# Patient Record
Sex: Female | Born: 2005 | Race: White | Hispanic: Yes | Marital: Single
Health system: Southern US, Community
[De-identification: ages and names within clinical notes are randomized; demographics above are authoritative.]

---
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
## 2023-02-23 DIAGNOSIS — J019 Acute sinusitis, unspecified: Secondary | ICD-10-CM | POA: Diagnosis not present

## 2023-02-23 DIAGNOSIS — J029 Acute pharyngitis, unspecified: Secondary | ICD-10-CM | POA: Diagnosis not present

## 2023-03-21 DIAGNOSIS — Z202 Contact with and (suspected) exposure to infections with a predominantly sexual mode of transmission: Secondary | ICD-10-CM | POA: Diagnosis not present

## 2023-03-21 DIAGNOSIS — R309 Painful micturition, unspecified: Secondary | ICD-10-CM | POA: Diagnosis not present

## 2023-06-13 DIAGNOSIS — J111 Influenza due to unidentified influenza virus with other respiratory manifestations: Secondary | ICD-10-CM | POA: Diagnosis not present

## 2023-08-23 DIAGNOSIS — J3489 Other specified disorders of nose and nasal sinuses: Secondary | ICD-10-CM | POA: Diagnosis not present

## 2023-08-23 DIAGNOSIS — N946 Dysmenorrhea, unspecified: Secondary | ICD-10-CM | POA: Diagnosis not present

## 2023-08-23 DIAGNOSIS — Z0000 Encounter for general adult medical examination without abnormal findings: Secondary | ICD-10-CM | POA: Diagnosis not present

## 2023-08-23 DIAGNOSIS — K5900 Constipation, unspecified: Secondary | ICD-10-CM | POA: Diagnosis not present

## 2023-09-12 IMAGING — US US ABDOMEN LIMITED
1 series · 14 of 25 positions shown · non-contrast
Comparison: None Available.

CLINICAL DATA: Epigastric pain.

EXAM:
ULTRASOUND ABDOMEN LIMITED RIGHT UPPER QUADRANT

[Series 1: us abdomen limited ruq (liver/gb) · 14 of 74 slices shown]
[im 1/74]
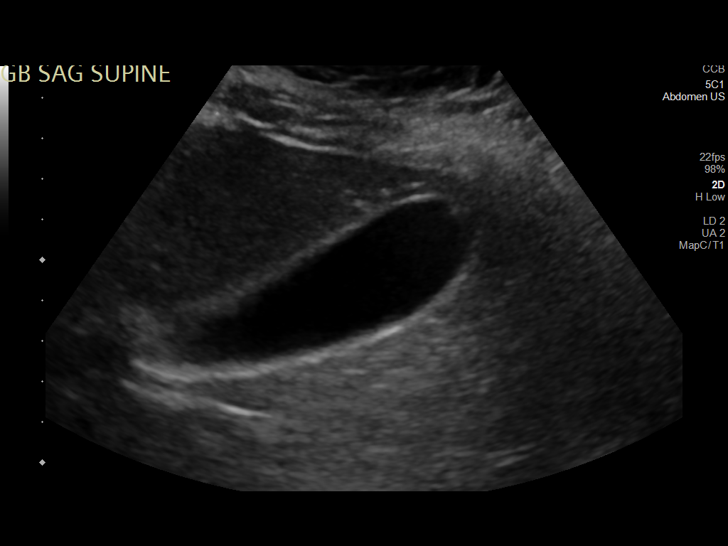
[im 7/74]
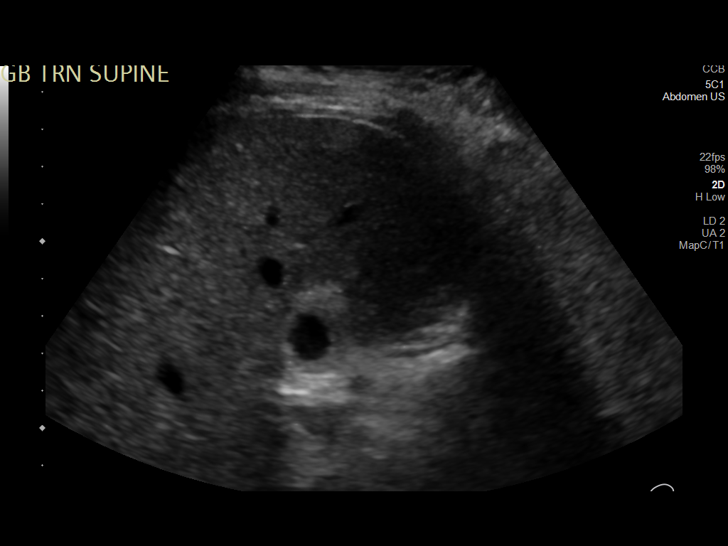
[im 13/74]
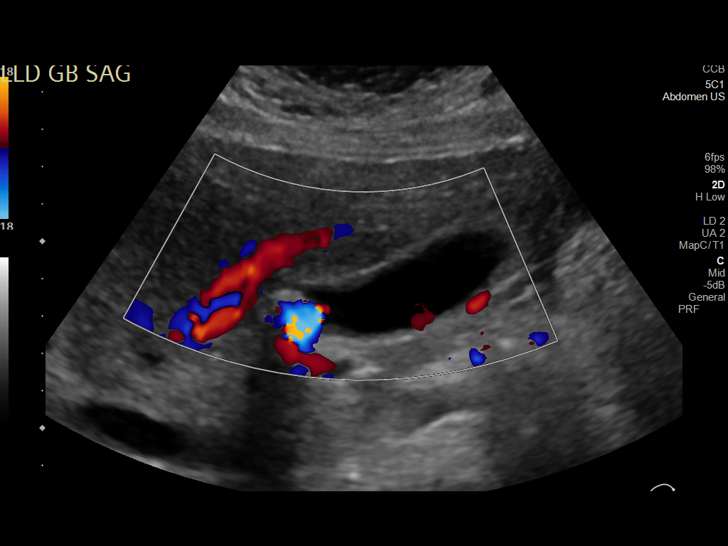
[im 19/74]
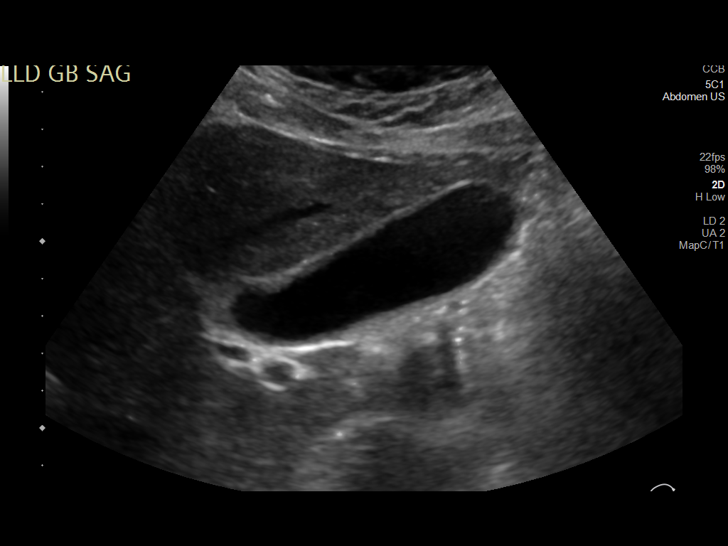
[im 25/74]
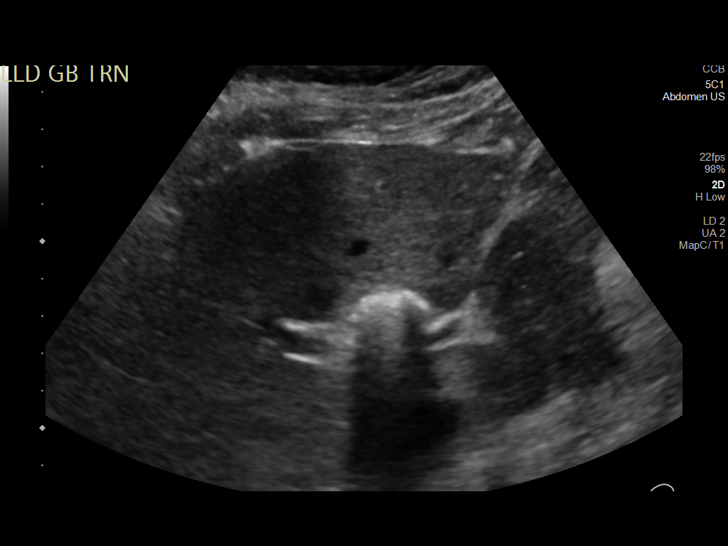
[im 28/74]
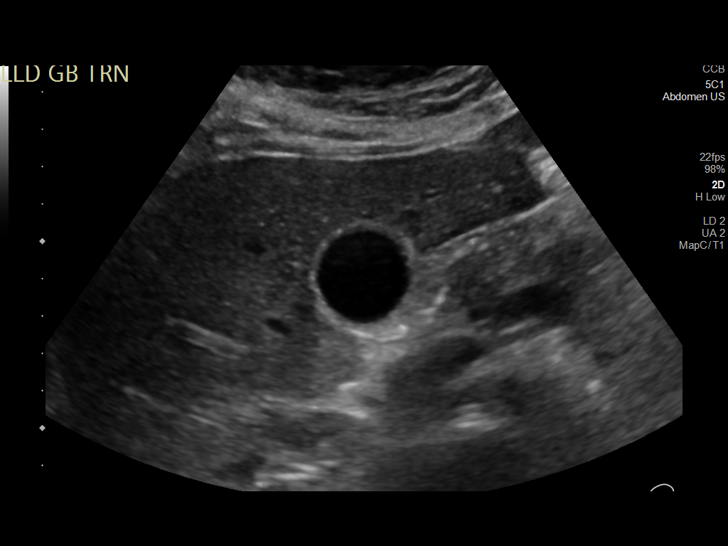
[im 34/74]
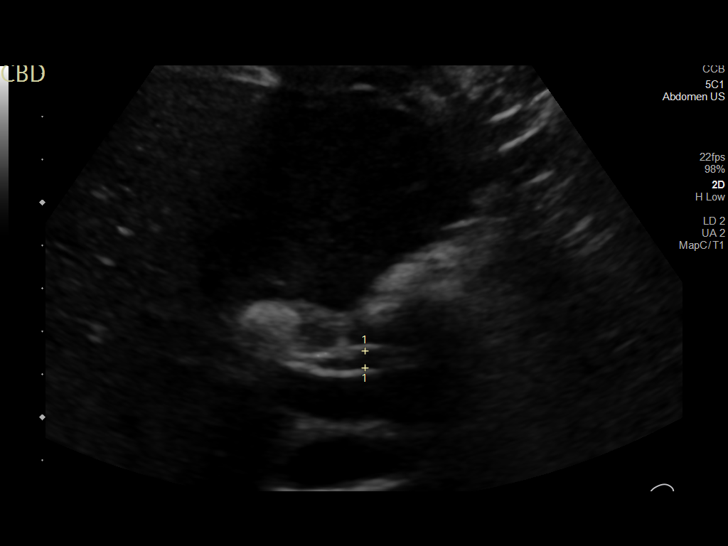
[im 40/74]
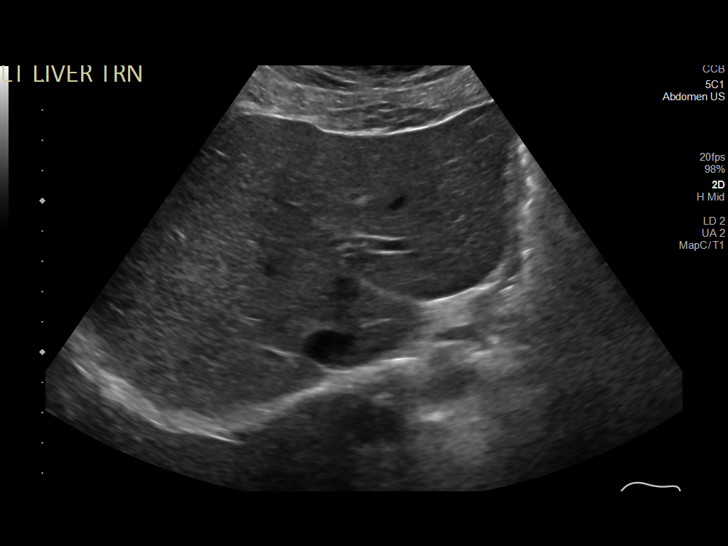
[im 46/74]
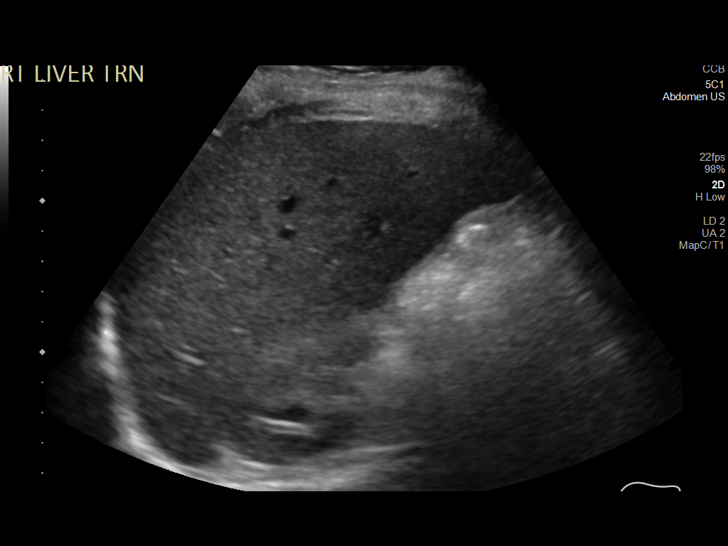
[im 49/74]
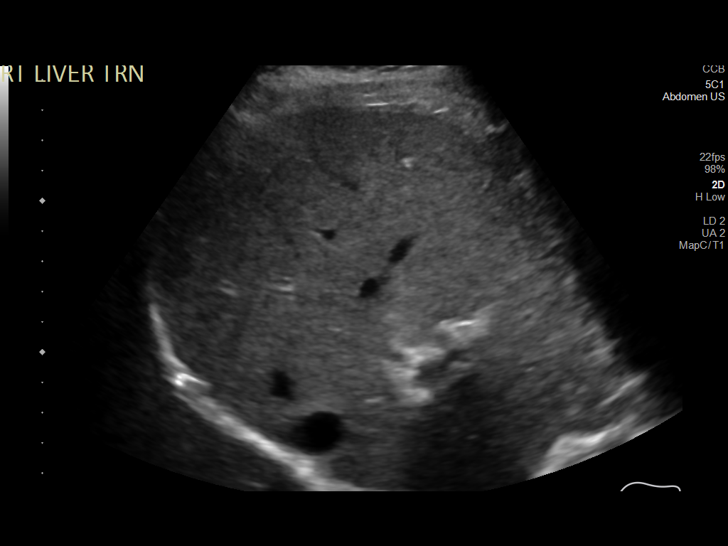
[im 55/74]
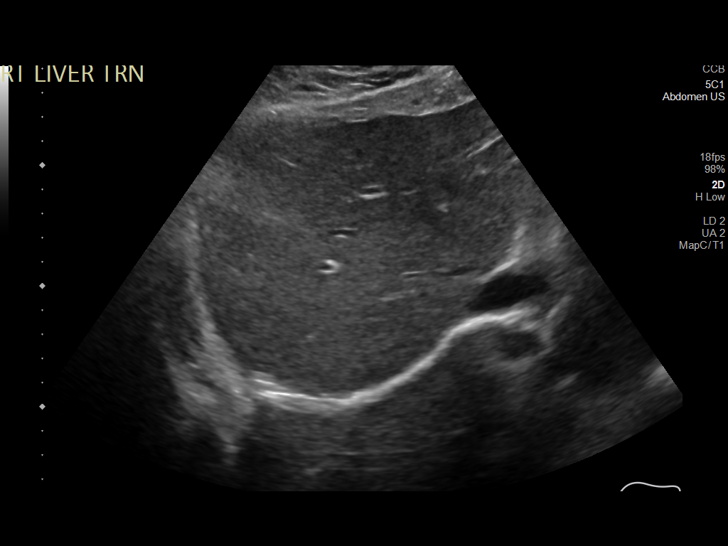
[im 61/74]
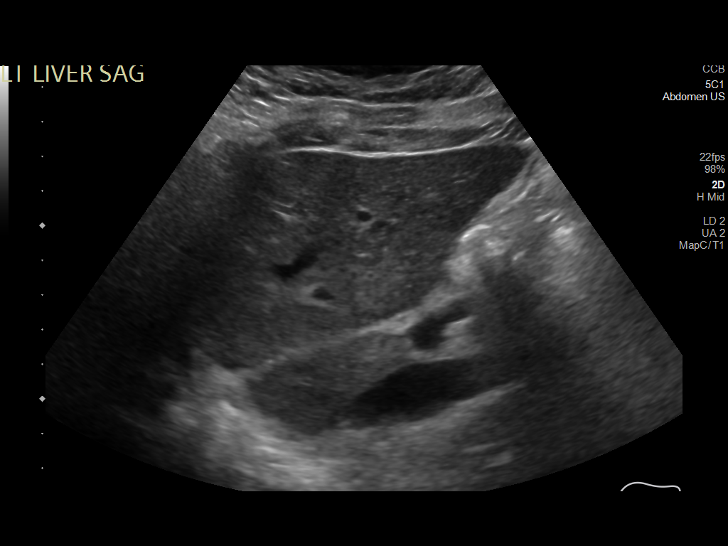
[im 67/74]
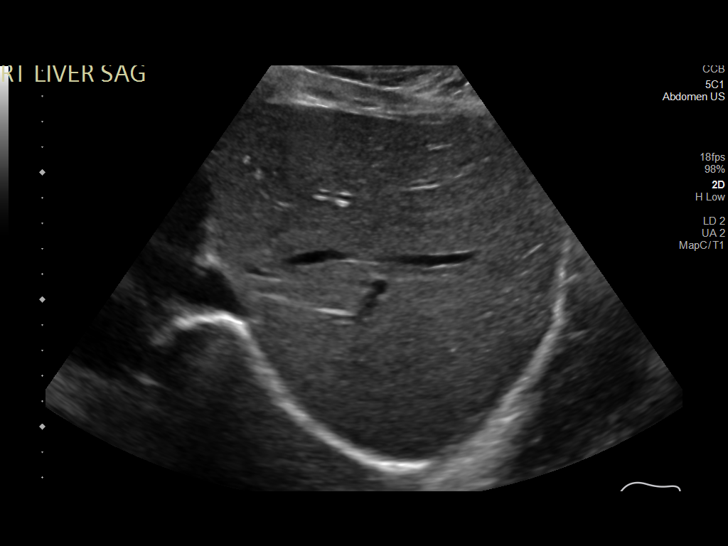
[im 74/74]
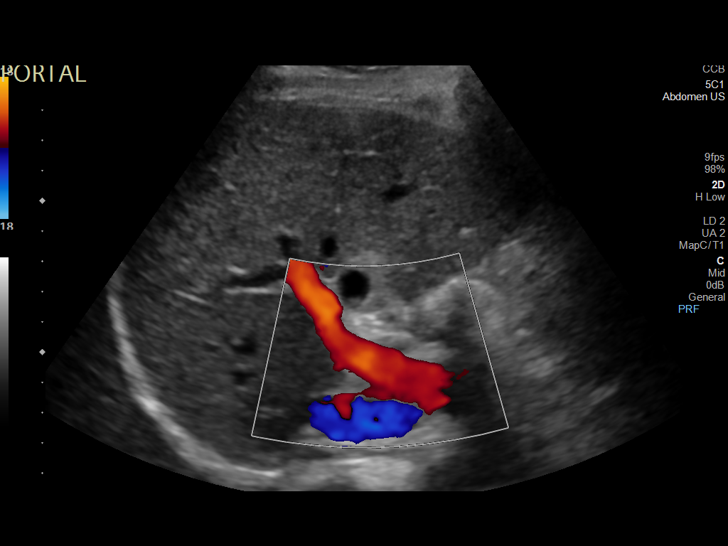

[14 of 25 positions shown; findings below may reference images not displayed]

FINDINGS: Gallbladder:

There is a nonmobile stone lodged within the gallbladder neck
measuring 2.1 cm. Mild gallbladder wall thickening measures 3.2 mm.
No pericholecystic fluid or sonographic Murphy's sign.

Common bile duct:

Diameter: 5.5 mm

Liver:

No focal lesion identified. Within normal limits in parenchymal
echogenicity. Portal vein is patent on color Doppler imaging with
normal direction of blood flow towards the liver.

Other: None.
IMPRESSION: 2.1 cm nonmobile gallstone within the gallbladder neck with mild
gallbladder wall thickening. Cannot rule out cholecystitis.

## 2023-10-23 DIAGNOSIS — R21 Rash and other nonspecific skin eruption: Secondary | ICD-10-CM | POA: Diagnosis not present

## 2023-10-25 IMAGING — US US ABDOMEN LIMITED
1 series · 13 of 25 positions shown · non-contrast
Comparison: Right upper quadrant ultrasound 09/21/2021.

CLINICAL DATA: Provided history: Right upper quadrant pain.

EXAM:
ULTRASOUND ABDOMEN LIMITED RIGHT UPPER QUADRANT

[Series 1: us abdomen limited ruq (liver/gb) · 13 of 72 slices shown]
[im 1/72]
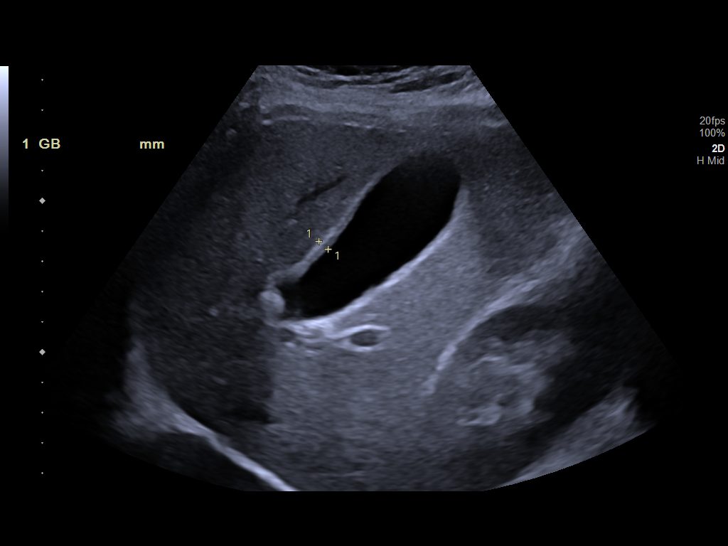
[im 6/72]
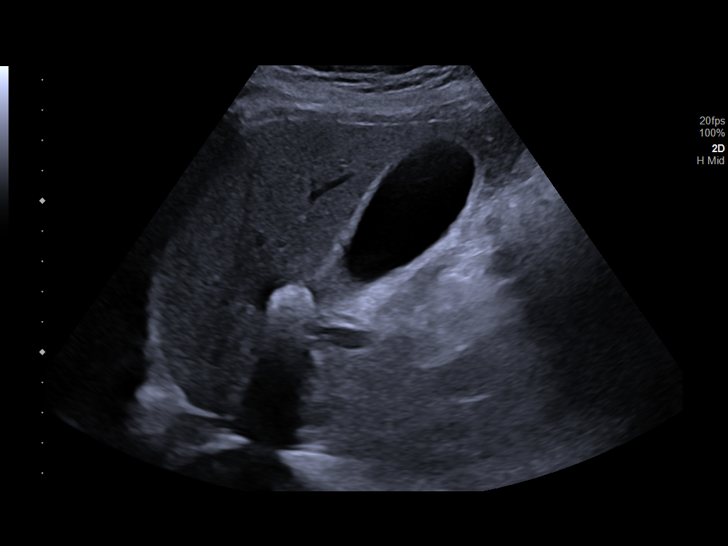
[im 12/72]
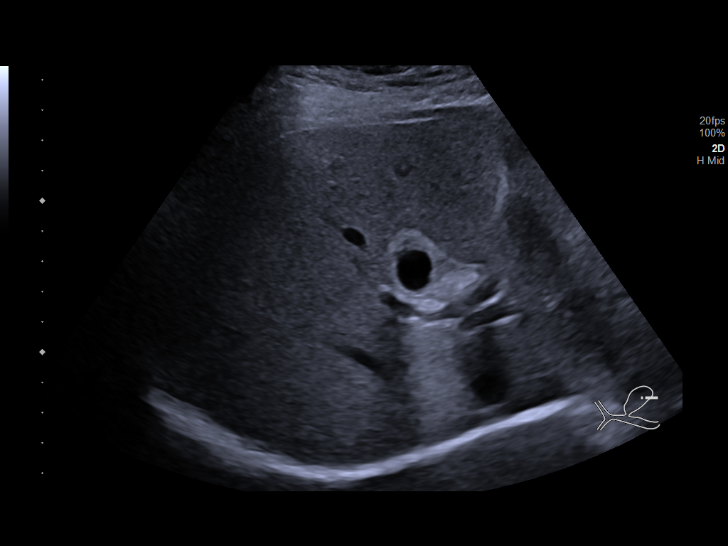
[im 18/72]
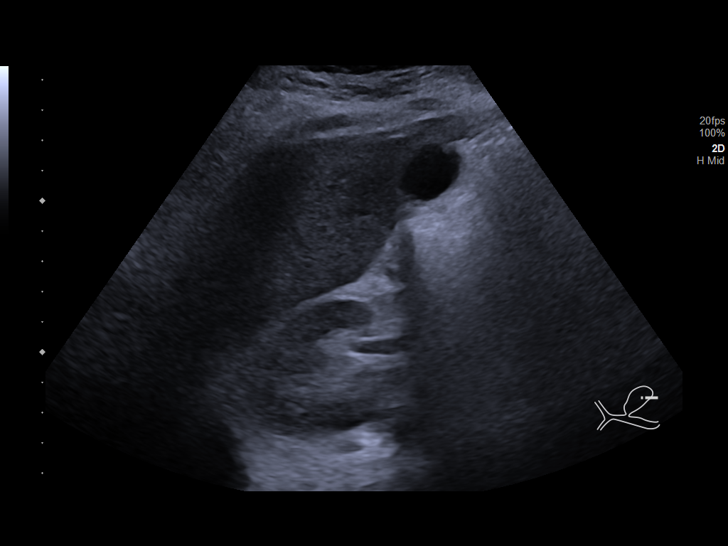
[im 24/72]
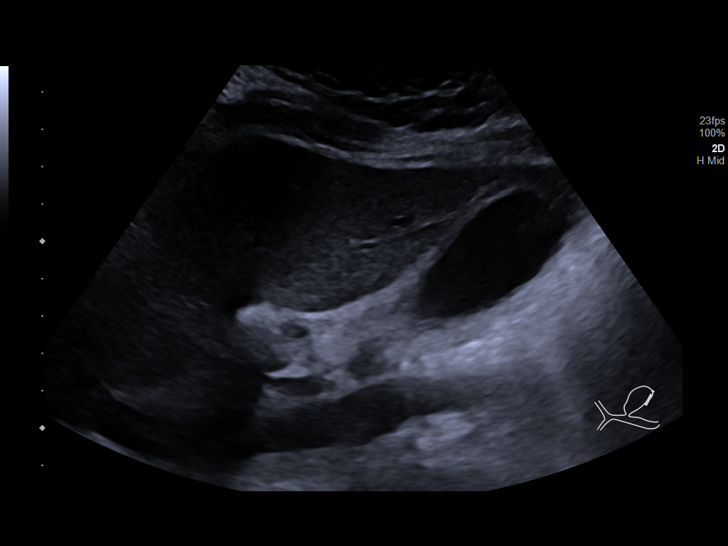
[im 30/72]
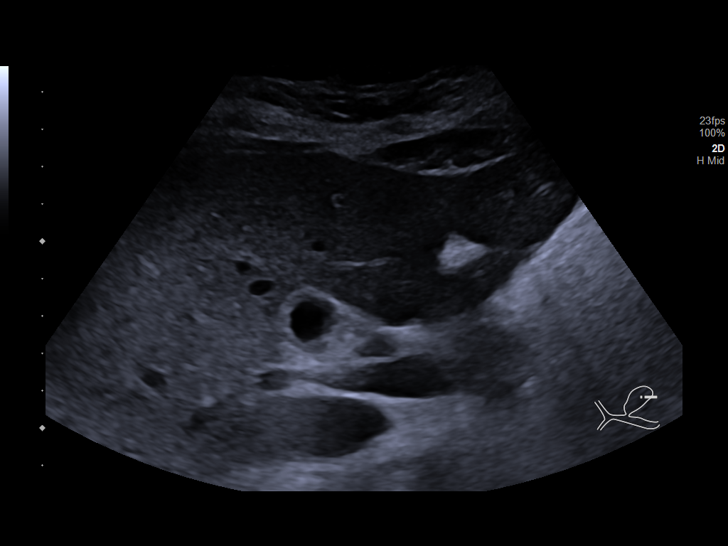
[im 36/72]
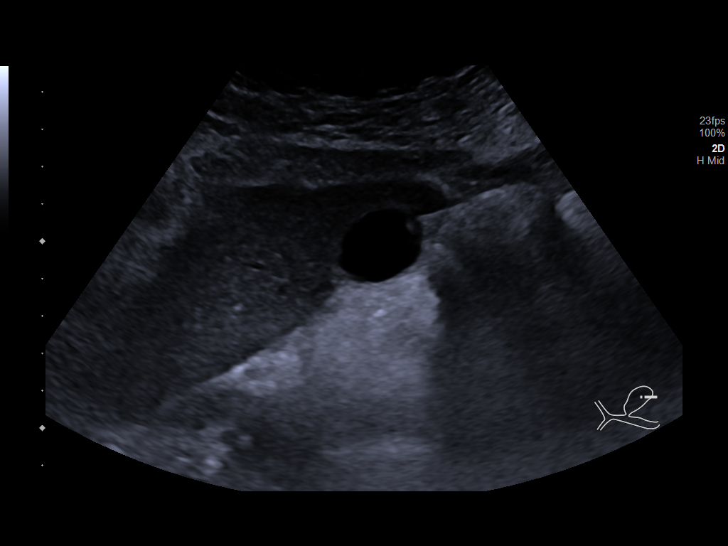
[im 42/72]
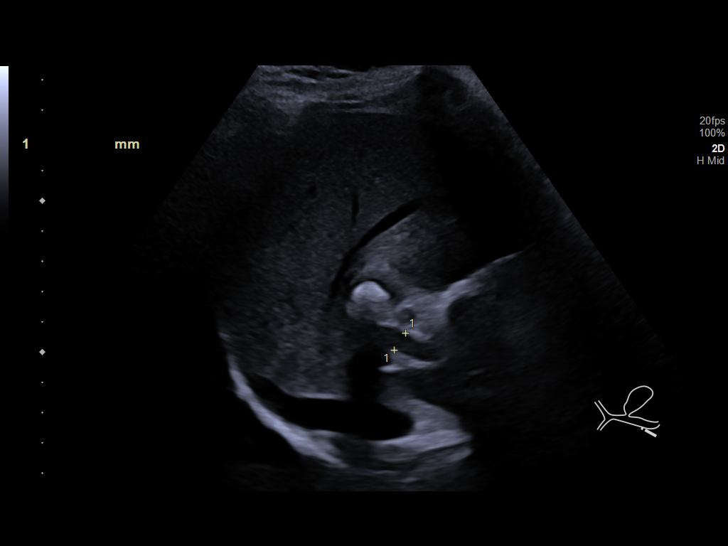
[im 48/72]
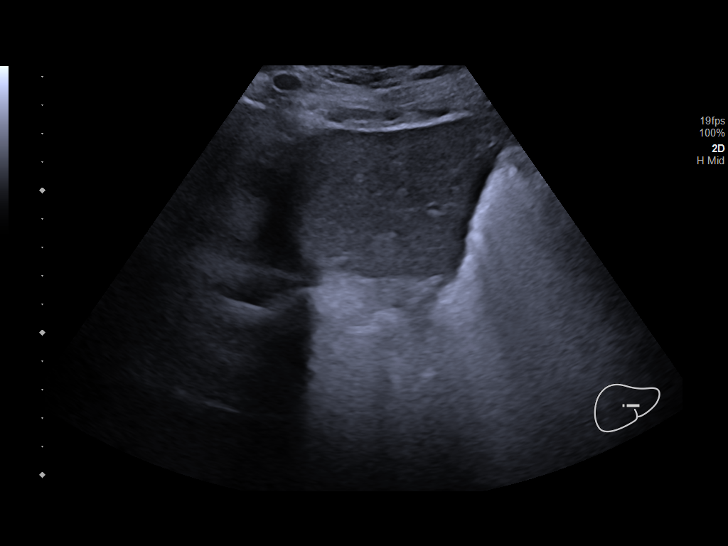
[im 54/72]
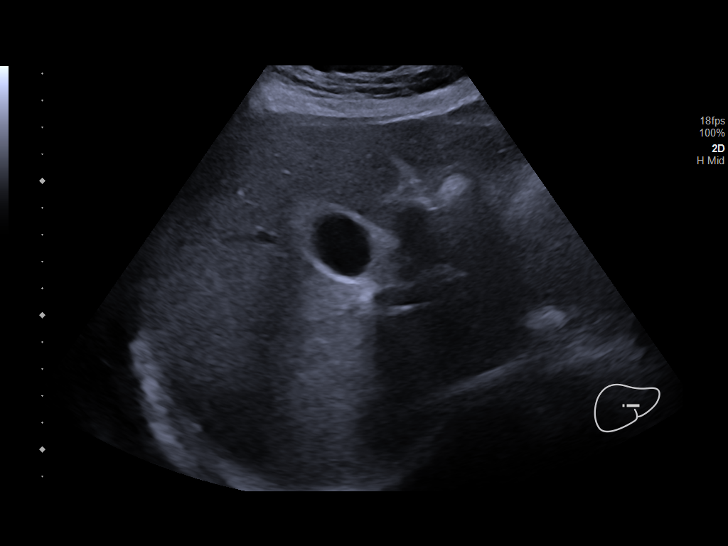
[im 60/72]
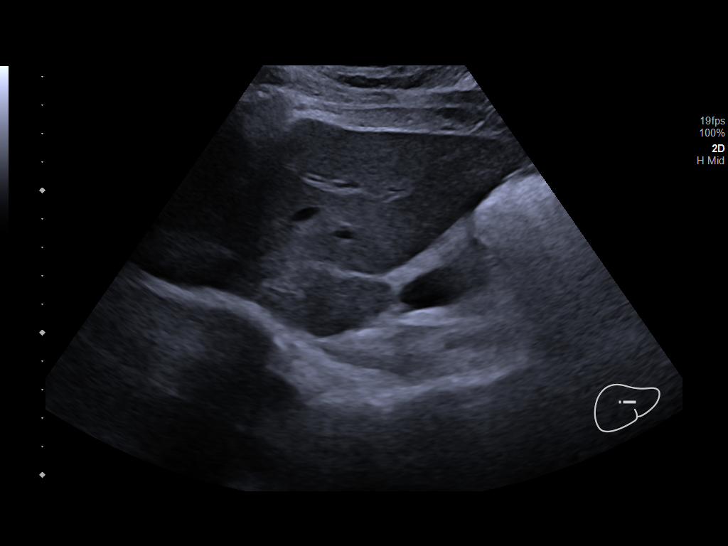
[im 66/72]
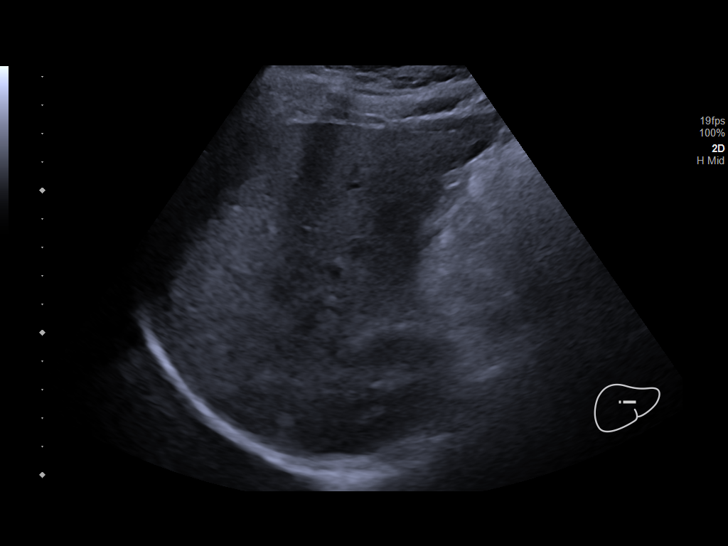
[im 72/72]
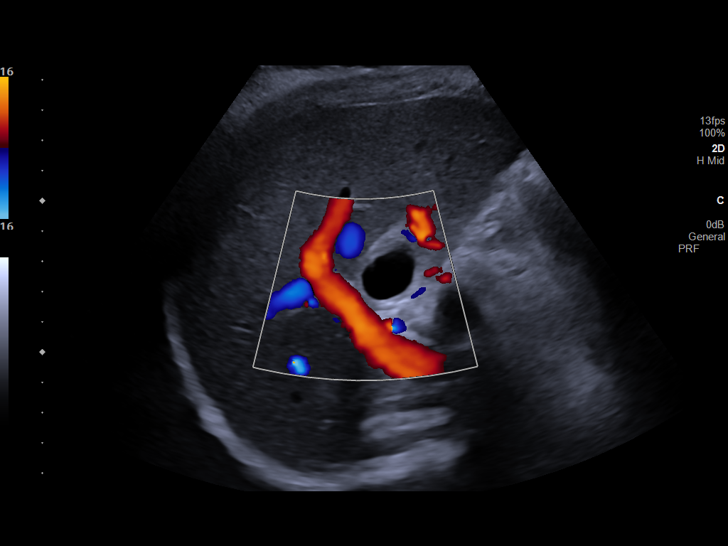

[13 of 25 positions shown; findings below may reference images not displayed]

FINDINGS: Gallbladder:

1.5 cm gallstone within the gallbladder neck. Mild gallbladder wall
thickening (measuring 4 mm). Trace pericholecystic fluid. Per the
scanning technologist, pain is elicited when scanning over the
gallbladder fossa.

Common bile duct:

Diameter: 8 mm, dilated.

Liver:

No focal lesion identified. Within normal limits in parenchymal
echogenicity. Portal vein is patent on color Doppler imaging with
normal direction of blood flow towards the liver.
IMPRESSION: 1.5 cm gallstone within the gallbladder neck. Associated mild
gallbladder wall thickening and trace pericholecystic fluid. The
scanning technologist reports pain is elicited while scanning over
the gallbladder fossa. This constellation of findings can be seen in
the setting of acute cholecystitis and clinical correlation is
recommended. A nuclear medicine HIDA scan may be obtained for
further evaluation, as warranted.

Dilated common bile duct (measuring 8 mm in diameter), new from the
prior right upper quadrant ultrasound of 09/21/2021. Consider
MRI/MRCP for further evaluation.
